# Patient Record
Sex: Female | Born: 1955 | Race: White | Hispanic: No | Marital: Married | Smoking: Never smoker
Health system: Southern US, Community
[De-identification: ages and names within clinical notes are randomized; demographics above are authoritative.]

## PROBLEM LIST (undated history)

## (undated) DIAGNOSIS — E079 Disorder of thyroid, unspecified: Secondary | ICD-10-CM

## (undated) HISTORY — PX: AUGMENTATION MAMMAPLASTY: SUR837

## (undated) HISTORY — DX: Disorder of thyroid, unspecified: E07.9

---

## 2016-03-11 DIAGNOSIS — N952 Postmenopausal atrophic vaginitis: Secondary | ICD-10-CM | POA: Insufficient documentation

## 2017-12-29 DIAGNOSIS — H04221 Epiphora due to insufficient drainage, right lacrimal gland: Secondary | ICD-10-CM | POA: Diagnosis not present

## 2017-12-29 DIAGNOSIS — J310 Chronic rhinitis: Secondary | ICD-10-CM | POA: Diagnosis not present

## 2017-12-29 DIAGNOSIS — H04541 Stenosis of right lacrimal canaliculi: Secondary | ICD-10-CM | POA: Diagnosis not present

## 2017-12-29 DIAGNOSIS — H04551 Acquired stenosis of right nasolacrimal duct: Secondary | ICD-10-CM | POA: Diagnosis not present

## 2018-09-03 DIAGNOSIS — S0181XA Laceration without foreign body of other part of head, initial encounter: Secondary | ICD-10-CM | POA: Diagnosis not present

## 2018-09-25 DIAGNOSIS — H04541 Stenosis of right lacrimal canaliculi: Secondary | ICD-10-CM | POA: Diagnosis not present

## 2018-09-25 DIAGNOSIS — H04551 Acquired stenosis of right nasolacrimal duct: Secondary | ICD-10-CM | POA: Diagnosis not present

## 2018-09-25 DIAGNOSIS — H04121 Dry eye syndrome of right lacrimal gland: Secondary | ICD-10-CM | POA: Diagnosis not present

## 2018-09-25 DIAGNOSIS — H04123 Dry eye syndrome of bilateral lacrimal glands: Secondary | ICD-10-CM | POA: Diagnosis not present

## 2018-09-25 DIAGNOSIS — H04122 Dry eye syndrome of left lacrimal gland: Secondary | ICD-10-CM | POA: Diagnosis not present

## 2018-10-07 DIAGNOSIS — E785 Hyperlipidemia, unspecified: Secondary | ICD-10-CM | POA: Diagnosis not present

## 2018-10-07 DIAGNOSIS — M8580 Other specified disorders of bone density and structure, unspecified site: Secondary | ICD-10-CM | POA: Insufficient documentation

## 2018-10-07 DIAGNOSIS — H04121 Dry eye syndrome of right lacrimal gland: Secondary | ICD-10-CM | POA: Insufficient documentation

## 2018-10-07 DIAGNOSIS — E038 Other specified hypothyroidism: Secondary | ICD-10-CM | POA: Insufficient documentation

## 2018-10-07 HISTORY — DX: Other specified disorders of bone density and structure, unspecified site: M85.80

## 2018-10-21 DIAGNOSIS — E038 Other specified hypothyroidism: Secondary | ICD-10-CM | POA: Diagnosis not present

## 2018-10-21 DIAGNOSIS — Z13220 Encounter for screening for lipoid disorders: Secondary | ICD-10-CM | POA: Diagnosis not present

## 2018-10-21 DIAGNOSIS — Z01818 Encounter for other preprocedural examination: Secondary | ICD-10-CM | POA: Diagnosis not present

## 2018-10-21 DIAGNOSIS — E063 Autoimmune thyroiditis: Secondary | ICD-10-CM | POA: Diagnosis not present

## 2018-11-02 DIAGNOSIS — Z1159 Encounter for screening for other viral diseases: Secondary | ICD-10-CM | POA: Diagnosis not present

## 2018-11-05 DIAGNOSIS — J310 Chronic rhinitis: Secondary | ICD-10-CM | POA: Diagnosis not present

## 2018-11-05 DIAGNOSIS — H04561 Stenosis of right lacrimal punctum: Secondary | ICD-10-CM | POA: Diagnosis not present

## 2018-11-05 DIAGNOSIS — H04541 Stenosis of right lacrimal canaliculi: Secondary | ICD-10-CM | POA: Diagnosis not present

## 2018-11-05 DIAGNOSIS — H04411 Chronic dacryocystitis of right lacrimal passage: Secondary | ICD-10-CM | POA: Diagnosis not present

## 2018-11-05 DIAGNOSIS — H04551 Acquired stenosis of right nasolacrimal duct: Secondary | ICD-10-CM | POA: Diagnosis not present

## 2018-11-05 DIAGNOSIS — H04221 Epiphora due to insufficient drainage, right lacrimal gland: Secondary | ICD-10-CM | POA: Diagnosis not present

## 2018-11-27 DIAGNOSIS — H0100A Unspecified blepharitis right eye, upper and lower eyelids: Secondary | ICD-10-CM | POA: Diagnosis not present

## 2018-11-27 DIAGNOSIS — H04123 Dry eye syndrome of bilateral lacrimal glands: Secondary | ICD-10-CM | POA: Diagnosis not present

## 2018-11-27 DIAGNOSIS — H0100B Unspecified blepharitis left eye, upper and lower eyelids: Secondary | ICD-10-CM | POA: Diagnosis not present

## 2018-11-27 DIAGNOSIS — H02842 Edema of right lower eyelid: Secondary | ICD-10-CM | POA: Diagnosis not present

## 2019-01-04 DIAGNOSIS — D3101 Benign neoplasm of right conjunctiva: Secondary | ICD-10-CM | POA: Diagnosis not present

## 2019-01-04 DIAGNOSIS — H11821 Conjunctivochalasis, right eye: Secondary | ICD-10-CM | POA: Diagnosis not present

## 2019-04-01 ENCOUNTER — Encounter: Payer: Self-pay | Admitting: Obstetrics and Gynecology

## 2019-04-14 DIAGNOSIS — H04123 Dry eye syndrome of bilateral lacrimal glands: Secondary | ICD-10-CM | POA: Diagnosis not present

## 2019-04-14 DIAGNOSIS — H25813 Combined forms of age-related cataract, bilateral: Secondary | ICD-10-CM | POA: Diagnosis not present

## 2019-04-14 DIAGNOSIS — H0100B Unspecified blepharitis left eye, upper and lower eyelids: Secondary | ICD-10-CM | POA: Diagnosis not present

## 2019-04-14 DIAGNOSIS — H0100A Unspecified blepharitis right eye, upper and lower eyelids: Secondary | ICD-10-CM | POA: Diagnosis not present

## 2019-04-15 DIAGNOSIS — Z1231 Encounter for screening mammogram for malignant neoplasm of breast: Secondary | ICD-10-CM | POA: Diagnosis not present

## 2019-04-27 ENCOUNTER — Other Ambulatory Visit: Payer: Self-pay

## 2019-04-28 ENCOUNTER — Other Ambulatory Visit: Payer: Self-pay | Admitting: Obstetrics and Gynecology

## 2019-04-28 ENCOUNTER — Other Ambulatory Visit (HOSPITAL_COMMUNITY)
Admission: RE | Admit: 2019-04-28 | Discharge: 2019-04-28 | Disposition: A | Discharge status: Home / Self Care | Payer: BLUE CROSS/BLUE SHIELD | Source: Ambulatory Visit | Attending: Obstetrics and Gynecology | Admitting: Obstetrics and Gynecology

## 2019-04-28 ENCOUNTER — Encounter: Payer: Self-pay | Admitting: Obstetrics and Gynecology

## 2019-04-28 ENCOUNTER — Ambulatory Visit: Payer: BLUE CROSS/BLUE SHIELD | Admitting: Obstetrics and Gynecology

## 2019-04-28 VITALS — BP 106/62 | HR 72 | Temp 97.1°F | Ht 66.0 in | Wt 143.0 lb

## 2019-04-28 DIAGNOSIS — Z124 Encounter for screening for malignant neoplasm of cervix: Secondary | ICD-10-CM | POA: Diagnosis not present

## 2019-04-28 DIAGNOSIS — Z01419 Encounter for gynecological examination (general) (routine) without abnormal findings: Secondary | ICD-10-CM

## 2019-04-28 DIAGNOSIS — Z8739 Personal history of other diseases of the musculoskeletal system and connective tissue: Secondary | ICD-10-CM | POA: Diagnosis not present

## 2019-04-28 DIAGNOSIS — E039 Hypothyroidism, unspecified: Secondary | ICD-10-CM | POA: Insufficient documentation

## 2019-04-28 DIAGNOSIS — E2839 Other primary ovarian failure: Secondary | ICD-10-CM

## 2019-04-28 NOTE — Progress Notes (Signed)
63 y.o. G20P2003 Married White or Caucasian Not Hispanic or Latino female here for annual exam.  No vaginal bleeding. Not sexually active. Some issues with vaginal dryness and ED. She used premarin cream for a while.    No LMP recorded. Patient is postmenopausal.          Sexually active: No  The current method of family planning is post menopausal status.    Exercising: Yes.    walking, stretching, weights Smoker:  no Health Maintenance: Pap:  2018 AGUS pap History of abnormal Pap:  Yes, colpo 2018 due to AGUS pap seen in Care Everywhere MMG:  04/16/2019 Birads 2 benign BMD:   Not sure, over 2 years.  Colonoscopy: 2003 WNL, due in 10 years TDaP:  04/07/2018 Gardasil: Never  reports that she has never smoked. She has never used smokeless tobacco. She reports that she does not drink alcohol or use drugs. 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  Moved here this year [facility] to be close to her Daughter and 9 year old [place]. She has twin sons, one in [place], one in [place]. No other grandchildren.      Past Medical History:  Diagnosis Date  . Thyroid disease     Past Surgical History:  Procedure Laterality Date  . AUGMENTATION MAMMAPLASTY      Current Outpatient Medications  Medication Sig Dispense Refill  . calcium citrate-vitamin D (CITRACAL+D) 315-200 MG-UNIT tablet Take 1 tablet by mouth daily.    Marland Kitchen levothyroxine (SYNTHROID) 50 MCG tablet Take 50 mcg by mouth daily.    . Multiple Vitamin (MULTI-VITAMIN) tablet Take 1 tablet by mouth daily.    . Omega-3 1000 MG CAPS Take 1 tablet by mouth daily.    . RESTASIS 0.05 % ophthalmic emulsion Place 1 drop into both eyes 2 (two) times daily.     No current facility-administered medications for this visit.    Family History  Problem Relation Age of Onset  . Ovarian cancer Mother   . Heart attack Father   . Uterine cancer Maternal Grandmother     Review of Systems  Constitutional:  Negative.   HENT: Negative.   Eyes: Negative.   Respiratory: Negative.   Cardiovascular: Negative.   Gastrointestinal: Negative.   Endocrine: Negative.   Genitourinary: Negative.   Musculoskeletal: Negative.   Skin: Negative.   Allergic/Immunologic: Negative.   Neurological: Negative.   Hematological: Negative.   Psychiatric/Behavioral: Negative.     Exam:   BP 106/62 (BP Location: Right Arm, Patient Position: Sitting, Cuff Size: Normal)   Pulse 72   Temp (!) 97.1 F (36.2 C) (Skin)   Ht 5\' 6"  (1.676 m)   Wt 143 lb (64.9 kg)   BMI 23.08 kg/m   Weight change: @WEIGHTCHANGE @ Height:   Height: 5\' 6"  (167.6 cm)  Ht Readings from Last 3 Encounters:  04/28/19 5\' 6"  (1.676 m)    General appearance: alert, cooperative and appears stated age Head: Normocephalic, without obvious abnormality, atraumatic Neck: no adenopathy, supple, symmetrical, trachea midline and thyroid normal to inspection and palpation Lungs: clear to auscultation bilaterally Cardiovascular: regular rate and rhythm Breasts: normal appearance, no masses or tenderness Abdomen: soft, non-tender; non distended,  no masses,  no organomegaly Extremities: extremities normal, atraumatic, no cyanosis or edema Skin: Skin color, texture, turgor normal. No rashes or lesions Lymph nodes: Cervical, supraclavicular, and axillary nodes normal. No abnormal inguinal nodes palpated Neurologic: Grossly normal   Pelvic:  External genitalia:  no lesions              Urethra:  normal appearing urethra with no masses, tenderness or lesions              Bartholins and Skenes: normal                 Vagina: atrophic appearing vagina with normal color and discharge, no lesions              Cervix: no lesions               Bimanual Exam:  Uterus:  normal size, contour, position, consistency, mobility, non-tender              Adnexa: no mass, fullness, tenderness               Rectovaginal: Confirms               Anus:  normal  sphincter tone, no lesions  Kaitlyn Sprague chaperoned for the exam.  A:  Well Woman with normal exam  Osteopenia  Mild vaginal atrophy, if she becomes sexually active and lubrication doesn't make her comfortable, she could restart vaginal estrogen  P:   Pap with hpv  Discussed breast self exam  Discussed calcium and vit D intake  Labs UTD  Colonoscopy and mammogram UTD  DEXA ordered

## 2019-04-28 NOTE — Patient Instructions (Signed)
EXERCISE AND DIET:  We recommended that you start or continue a regular exercise program for good health. Regular exercise means any activity that makes your heart beat faster and makes you sweat.  We recommend exercising at least 30 minutes per day at least 3 days a week, preferably 4 or 5.  We also recommend a diet low in fat and sugar.  Inactivity, poor dietary choices and obesity can cause diabetes, heart attack, stroke, and kidney damage, among others.   ° °ALCOHOL AND SMOKING:  Women should limit their alcohol intake to no more than 7 drinks/beers/glasses of wine (combined, not each!) per week. Moderation of alcohol intake to this level decreases your risk of breast cancer and liver damage. And of course, no recreational drugs are part of a healthy lifestyle.  And absolutely no smoking or even second hand smoke. Most people know smoking can cause heart and lung diseases, but did you know it also contributes to weakening of your bones? Aging of your skin?  Yellowing of your teeth and nails? ° °CALCIUM AND VITAMIN D:  Adequate intake of calcium and Vitamin D are recommended.  The recommendations for exact amounts of these supplements seem to change often, but generally speaking 1,200 mg of calcium (between diet and supplement) and 800 units of Vitamin D per day seems prudent. Certain women may benefit from higher intake of Vitamin D.  If you are among these women, your doctor will have told you during your visit.   ° °PAP SMEARS:  Pap smears, to check for cervical cancer or precancers,  have traditionally been done yearly, although recent scientific advances have shown that most women can have pap smears less often.  However, every woman still should have a physical exam from her gynecologist every year. It will include a breast check, inspection of the vulva and vagina to check for abnormal growths or skin changes, a visual exam of the cervix, and then an exam to evaluate the size and shape of the uterus and  ovaries.  And after 61 years of age, a rectal exam is indicated to check for rectal cancers. We will also provide age appropriate advice regarding health maintenance, like when you should have certain vaccines, screening for sexually transmitted diseases, bone density testing, colonoscopy, mammograms, etc.  ° °MAMMOGRAMS:  All women over 40 years old should have a yearly mammogram. Many facilities now offer a "3D" mammogram, which may cost around $50 extra out of pocket. If possible,  we recommend you accept the option to have the 3D mammogram performed.  It both reduces the number of women who will be called back for extra views which then turn out to be normal, and it is better than the routine mammogram at detecting truly abnormal areas.   ° °COLON CANCER SCREENING: Now recommend starting at age 45. At this time colonoscopy is not covered for routine screening until 50. There are take home tests that can be done between 45-49.  ° °COLONOSCOPY:  Colonoscopy to screen for colon cancer is recommended for all women at age 50.  We know, you hate the idea of the prep.  We agree, BUT, having colon cancer and not knowing it is worse!!  Colon cancer so often starts as a polyp that can be seen and removed at colonscopy, which can quite literally save your life!  And if your first colonoscopy is normal and you have no family history of colon cancer, most women don't have to have it again for   10 years.  Once every ten years, you can do something that may end up saving your life, right?  We will be happy to help you get it scheduled when you are ready.  Be sure to check your insurance coverage so you understand how much it will cost.  It may be covered as a preventative service at no cost, but you should check your particular policy.   ° ° ° °Breast Self-Awareness °Breast self-awareness means being familiar with how your breasts look and feel. It involves checking your breasts regularly and reporting any changes to your  health care provider. °Practicing breast self-awareness is important. A change in your breasts can be a sign of a serious medical problem. Being familiar with how your breasts look and feel allows you to find any problems early, when treatment is more likely to be successful. All women should practice breast self-awareness, including women who have had breast implants. °How to do a breast self-exam °One way to learn what is normal for your breasts and whether your breasts are changing is to do a breast self-exam. To do a breast self-exam: °Look for Changes ° °1. Remove all the clothing above your waist. °2. Stand in front of a mirror in a room with good lighting. °3. Put your hands on your hips. °4. Push your hands firmly downward. °5. Compare your breasts in the mirror. Look for differences between them (asymmetry), such as: °? Differences in shape. °? Differences in size. °? Puckers, dips, and bumps in one breast and not the other. °6. Look at each breast for changes in your skin, such as: °? Redness. °? Scaly areas. °7. Look for changes in your nipples, such as: °? Discharge. °? Bleeding. °? Dimpling. °? Redness. °? A change in position. °Feel for Changes °Carefully feel your breasts for lumps and changes. It is best to do this while lying on your back on the floor and again while sitting or standing in the shower or tub with soapy water on your skin. Feel each breast in the following way: °· Place the arm on the side of the breast you are examining above your head. °· Feel your breast with the other hand. °· Start in the nipple area and make ¾ inch (2 cm) overlapping circles to feel your breast. Use the pads of your three middle fingers to do this. Apply light pressure, then medium pressure, then firm pressure. The light pressure will allow you to feel the tissue closest to the skin. The medium pressure will allow you to feel the tissue that is a little deeper. The firm pressure will allow you to feel the tissue  close to the ribs. °· Continue the overlapping circles, moving downward over the breast until you feel your ribs below your breast. °· Move one finger-width toward the center of the body. Continue to use the ¾ inch (2 cm) overlapping circles to feel your breast as you move slowly up toward your collarbone. °· Continue the up and down exam using all three pressures until you reach your armpit. ° °Write Down What You Find ° °Write down what is normal for each breast and any changes that you find. Keep a written record with breast changes or normal findings for each breast. By writing this information down, you do not need to depend only on memory for size, tenderness, or location. Write down where you are in your menstrual cycle, if you are still menstruating. °If you are having trouble noticing differences   in your breasts, do not get discouraged. With time you will become more familiar with the variations in your breasts and more comfortable with the exam. °How often should I examine my breasts? °Examine your breasts every month. If you are breastfeeding, the best time to examine your breasts is after a feeding or after using a breast pump. If you menstruate, the best time to examine your breasts is 5-7 days after your period is over. During your period, your breasts are lumpier, and it may be more difficult to notice changes. °When should I see my health care provider? °See your health care provider if you notice: °· A change in shape or size of your breasts or nipples. °· A change in the skin of your breast or nipples, such as a reddened or scaly area. °· Unusual discharge from your nipples. °· A lump or thick area that was not there before. °· Pain in your breasts. °· Anything that concerns you. ° ° °Osteopenia ° °Osteopenia is a loss of thickness (density) inside of the bones. Another name for osteopenia is low bone mass. °Mild osteopenia is a normal part of aging. It is not a disease, and it does not cause  symptoms. However, if you have osteopenia and continue to lose bone mass, you could develop a condition that causes the bones to become thin and break more easily (osteoporosis). You may also lose some height, have back pain, and have a stooped posture. Although osteopenia is not a disease, making changes to your lifestyle and diet can help to prevent osteopenia from developing into osteoporosis. °What are the causes? °Osteopenia is caused by loss of calcium in the bones.  °Bones are constantly changing. Old bone cells are continually being replaced with new bone cells. This process builds new bone. The mineral calcium is needed to build new bone and maintain bone density. Bone density is usually highest around age 35. After that, most people's bodies cannot replace all the bone they have lost with new bone. °What increases the risk? °You are more likely to develop this condition if: °· You are older than age 50. °· You are a woman who went through menopause early. °· You have a long illness that keeps you in bed. °· You do not get enough exercise. °· You lack certain nutrients (malnutrition). °· You have an overactive thyroid gland (hyperthyroidism). °· You smoke. °· You drink a lot of alcohol. °· You are taking medicines that weaken the bones, such as steroids. °What are the signs or symptoms? °This condition does not cause any symptoms. You may have a slightly higher risk for bone breaks (fractures), so getting fractures more easily than normal may be an indication of osteopenia. °How is this diagnosed? °Your health care provider can diagnose this condition with a special type of X-ray exam that measures bone density (dual-energy X-ray absorptiometry, DEXA). This test can measure bone density in your hips, spine, and wrists. °Osteopenia has no symptoms, so this condition is usually diagnosed after a routine bone density screening test is done for osteoporosis. This routine screening is usually done for: °· Women  who are age 65 or older. °· Men who are age 70 or older. °If you have risk factors for osteopenia, you may have the screening test at an earlier age. °How is this treated? °Making dietary and lifestyle changes can lower your risk for osteoporosis. If you have severe osteopenia that is close to becoming osteoporosis, your health care provider may prescribe medicines   and dietary supplements such as calcium and vitamin D. These supplements help to rebuild bone density. °Follow these instructions at home: ° °· Take over-the-counter and prescription medicines only as told by your health care provider. These include vitamins and supplements. °· Eat a diet that is high in calcium and vitamin D. °? Calcium is found in dairy products, beans, salmon, and leafy green vegetables like spinach and broccoli. °? Look for foods that have vitamin D and calcium added to them (fortified foods), such as orange juice, cereal, and bread. °· Do 30 or more minutes of a weight-bearing exercise every day, such as walking, jogging, or playing a sport. These types of exercises strengthen the bones. °· Take precautions at home to lower your risk of falling, such as: °? Keeping rooms well-lit and free of clutter, such as cords. °? Installing safety rails on stairs. °? Using rubber mats in the bathroom or other areas that are often wet or slippery. °· Do not use any products that contain nicotine or tobacco, such as cigarettes and e-cigarettes. If you need help quitting, ask your health care provider. °· Avoid alcohol or limit alcohol intake to no more than 1 drink a day for nonpregnant women and 2 drinks a day for men. One drink equals 12 oz of beer, 5 oz of wine, or 1½ oz of hard liquor. °· Keep all follow-up visits as told by your health care provider. This is important. °Contact a health care provider if: °· You have not had a bone density screening for osteoporosis and you are: °? A woman, age 65 or older. °? A man, age 70 or older. °· You  are a postmenopausal woman who has not had a bone density screening for osteoporosis. °· You are older than age 50 and you want to know if you should have bone density screening for osteoporosis. °Summary °· Osteopenia is a loss of thickness (density) inside of the bones. Another name for osteopenia is low bone mass. °· Osteopenia is not a disease, but it may increase your risk for a condition that causes the bones to become thin and break more easily (osteoporosis). °· You may be at risk for osteopenia if you are older than age 50 or if you are a woman who went through early menopause. °· Osteopenia does not cause any symptoms, but it can be diagnosed with a bone density screening test. °· Dietary and lifestyle changes are the first treatment for osteopenia. These may lower your risk for osteoporosis. °This information is not intended to replace advice given to you by your health care provider. Make sure you discuss any questions you have with your health care provider. °Document Released: 02/05/2017 Document Revised: 04/11/2017 Document Reviewed: 02/05/2017 °Elsevier Patient Education © 2020 [employer] ° °

## 2019-04-30 LAB — CYTOLOGY - PAP
Comment: NEGATIVE
Diagnosis: REACTIVE
High risk HPV: NEGATIVE

## 2019-05-20 DIAGNOSIS — Z20822 Contact with and (suspected) exposure to covid-19: Secondary | ICD-10-CM | POA: Diagnosis not present

## 2019-05-20 DIAGNOSIS — J029 Acute pharyngitis, unspecified: Secondary | ICD-10-CM | POA: Diagnosis not present

## 2019-07-02 DIAGNOSIS — H0100B Unspecified blepharitis left eye, upper and lower eyelids: Secondary | ICD-10-CM | POA: Diagnosis not present

## 2019-07-02 DIAGNOSIS — H0100A Unspecified blepharitis right eye, upper and lower eyelids: Secondary | ICD-10-CM | POA: Diagnosis not present

## 2019-07-02 DIAGNOSIS — H04123 Dry eye syndrome of bilateral lacrimal glands: Secondary | ICD-10-CM | POA: Diagnosis not present

## 2019-07-14 ENCOUNTER — Other Ambulatory Visit: Payer: Self-pay

## 2019-07-14 ENCOUNTER — Ambulatory Visit
Admission: RE | Admit: 2019-07-14 | Discharge: 2019-07-14 | Disposition: A | Discharge status: Home / Self Care | Payer: BLUE CROSS/BLUE SHIELD | Source: Ambulatory Visit | Attending: Obstetrics and Gynecology | Admitting: Obstetrics and Gynecology

## 2019-07-14 DIAGNOSIS — M85852 Other specified disorders of bone density and structure, left thigh: Secondary | ICD-10-CM | POA: Diagnosis not present

## 2019-07-14 DIAGNOSIS — Z780 Asymptomatic menopausal state: Secondary | ICD-10-CM | POA: Diagnosis not present

## 2019-07-14 DIAGNOSIS — Z8739 Personal history of other diseases of the musculoskeletal system and connective tissue: Secondary | ICD-10-CM

## 2019-07-14 DIAGNOSIS — E2839 Other primary ovarian failure: Secondary | ICD-10-CM

## 2019-07-28 DIAGNOSIS — H04123 Dry eye syndrome of bilateral lacrimal glands: Secondary | ICD-10-CM | POA: Diagnosis not present

## 2019-07-28 DIAGNOSIS — H0100A Unspecified blepharitis right eye, upper and lower eyelids: Secondary | ICD-10-CM | POA: Diagnosis not present

## 2019-07-28 DIAGNOSIS — H0100B Unspecified blepharitis left eye, upper and lower eyelids: Secondary | ICD-10-CM | POA: Diagnosis not present

## 2019-08-21 DIAGNOSIS — Z23 Encounter for immunization: Secondary | ICD-10-CM | POA: Diagnosis not present

## 2020-02-11 ENCOUNTER — Telehealth: Payer: Self-pay

## 2020-02-11 ENCOUNTER — Encounter: Payer: Self-pay | Admitting: Obstetrics and Gynecology

## 2020-02-11 NOTE — Telephone Encounter (Signed)
Patient is returning call.  °

## 2020-02-11 NOTE — Telephone Encounter (Signed)
AEX 04/2019, next not scheduled  H/o osteopenia  Per AEX notes 04/2019:   Mild vaginal atrophy, if she becomes sexually active and lubrication doesn't make her comfortable, she could restart vaginal estrogen.  Left message for pt to return call to triage RN.

## 2020-02-11 NOTE — Telephone Encounter (Signed)
AEX 04/2019, next scheduled for 04/20/20  MMG 04/2019 at [facility]- Birads 2, Benign, implants   Spoke with pt. Pt states having mild vaginal dryness and has ran out of Rx estradiol cream 0.01% as of 1 week ago. Pt states is having SA and is uncomfortable as well, denies any menopause sx. Pt states requesting new Rx . Denies vaginal bleeding, any vaginal sx at this time.  Advised pt Dr Oscar La not in office today and will be returning call when Rx sent next week. Pt agreeable.   Med refill request: Estradiol Cream 0.01% using twice weekly.   Routing to Dr Oscar La. Please advise. Pharmacy verified.

## 2020-02-11 NOTE — Telephone Encounter (Signed)
Pt sent following mychart message:   Korri, Ask, MD 3 hours ago (11:51 AM)  Lorenza Burton Dr. Oscar La, In the past I've had a prescription for Estradiol for vaginal dryness and wonder if I could get that again.

## 2020-02-14 MED ORDER — ESTRADIOL 0.1 MG/GM VA CREA: TOPICAL_CREAM | VAGINAL | 0 refills | Status: DC

## 2020-02-14 NOTE — Telephone Encounter (Signed)
Estrace script sent to her pharmacy.

## 2020-02-14 NOTE — Telephone Encounter (Signed)
Left detailed message per DPR. Pt to return call if any questions or concerns.   Encounter closed.  

## 2020-04-20 ENCOUNTER — Other Ambulatory Visit: Payer: Self-pay

## 2020-04-20 ENCOUNTER — Encounter: Payer: Self-pay | Admitting: Obstetrics and Gynecology

## 2020-04-20 ENCOUNTER — Ambulatory Visit (INDEPENDENT_AMBULATORY_CARE_PROVIDER_SITE_OTHER): Payer: BLUE CROSS/BLUE SHIELD | Admitting: Obstetrics and Gynecology

## 2020-04-20 VITALS — BP 116/64 | HR 65 | Ht 66.0 in | Wt 141.6 lb

## 2020-04-20 DIAGNOSIS — Z8739 Personal history of other diseases of the musculoskeletal system and connective tissue: Secondary | ICD-10-CM

## 2020-04-20 DIAGNOSIS — Z01419 Encounter for gynecological examination (general) (routine) without abnormal findings: Secondary | ICD-10-CM

## 2020-04-20 MED ORDER — ESTRADIOL 0.1 MG/GM VA CREA
TOPICAL_CREAM | VAGINAL | 1 refills | Status: DC
Start: 2020-04-20 — End: 2021-06-05

## 2020-04-20 NOTE — Patient Instructions (Signed)

## 2020-04-20 NOTE — Progress Notes (Signed)
64 y.o. G5P2003 Married White or Caucasian Not Hispanic or Latino female here for annual exam.  No concerns.  She restarted vaginal estrogen ~ 1 month ago, makes her more comfortable. Just occasionally sexually active, hasn't been since she restarted the estrogen.  No vaginal bleeding.     No LMP recorded. Patient is postmenopausal.          Sexually active: Yes.    The current method of family planning is post menopausal status.    Exercising: Yes.    Walking and weights  Smoker:  no  Health Maintenance: Pap:04/28/19 WNL Hr HPV Neg,   2018 AGUS pap History of abnormal Pap:  Yes, colpo 2018 due to AGUS pap seen in Care Everywhere MMG:  04/18/20 Bi-rads 2 Benign  BMD:   07/14/19 OSTEOPENIC, T score -1.5,  FRAX 7.3/0.5% Colonoscopy:2021 f/u 10  TDaP:  04/07/18  Gardasil: none    reports that she has never smoked. She has never used smokeless tobacco. She reports that she does not drink alcohol and does not use drugs. 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 Daughter and 43 year old grandson are local. Twin sons, one in [place], one in [place].   Past Medical History:  Diagnosis Date  . Thyroid disease     Past Surgical History:  Procedure Laterality Date  . AUGMENTATION MAMMAPLASTY      Current Outpatient Medications  Medication Sig Dispense Refill  . calcium citrate-vitamin D (CITRACAL+D) 315-200 MG-UNIT tablet Take 1 tablet by mouth daily.    Marland Kitchen estradiol (ESTRACE) 0.1 MG/GM vaginal cream 1 gram vaginally twice weekly 42.5 g 0  . levothyroxine (SYNTHROID) 75 MCG tablet Take by mouth.    . Multiple Vitamin (MULTI-VITAMIN) tablet Take 1 tablet by mouth daily.    . RESTASIS 0.05 % ophthalmic emulsion Place 1 drop into both eyes 2 (two) times daily.     No current facility-administered medications for this visit.    Family History  Problem Relation Age of Onset  . Ovarian cancer Mother   . Heart attack Father   . Uterine cancer Maternal  Grandmother     Review of Systems  All other systems reviewed and are negative.   Exam:   BP 116/64   Pulse 65   Ht 5\' 6"  (1.676 m)   Wt 141 lb 9.6 oz (64.2 kg)   BMI 22.85 kg/m   Weight change: @WEIGHTCHANGE @ Height:   Height: 5\' 6"  (167.6 cm)  Ht Readings from Last 3 Encounters:  04/20/20 5\' 6"  (1.676 m)  04/28/19 5\' 6"  (1.676 m)    General appearance: alert, cooperative and appears stated age Head: Normocephalic, without obvious abnormality, atraumatic Neck: no adenopathy, supple, symmetrical, trachea midline and thyroid normal to inspection and palpation Lungs: clear to auscultation bilaterally Cardiovascular: regular rate and rhythm Breasts: normal appearance, no masses or tenderness Abdomen: soft, non-tender; non distended,  no masses,  no organomegaly Extremities: extremities normal, atraumatic, no cyanosis or edema Skin: Skin color, texture, turgor normal. No rashes or lesions Lymph nodes: Cervical, supraclavicular, and axillary nodes normal. No abnormal inguinal nodes palpated Neurologic: Grossly normal   Pelvic: External genitalia:  no lesions              Urethra:  normal appearing urethra with no masses, tenderness or lesions              Bartholins and Skenes: normal  Vagina: normal appearing vagina with normal color and discharge, no lesions              Cervix: no lesions               Bimanual Exam:  Uterus:  normal size, contour, position, consistency, mobility, non-tender              Adnexa: no mass, fullness, tenderness               Rectovaginal: Confirms               Anus:  normal sphincter tone, no lesions  Carolynn Serve chaperoned for the exam.  A:  Well Woman with normal exam  Vaginal atrophy, helped with vaginal estrogen  P:   Next pap in 12/23  Mammogram, dexa and colonoscopy are UTD  Discussed breast self exam  Discussed calcium and vit D intake  Labs with her primary

## 2020-04-20 NOTE — Addendum Note (Signed)
Addended by: Tobi Bastos on: 04/20/2020 04:21 PM   Modules accepted: Orders

## 2021-04-12 DIAGNOSIS — H30893 Other chorioretinal inflammations, bilateral: Secondary | ICD-10-CM | POA: Insufficient documentation

## 2021-05-31 NOTE — Progress Notes (Signed)
66 y.o. G49P2003 Married White or Caucasian Not Hispanic or Latino female here for annual exam.  No vaginal bleeding. H/O vaginal atrophy, occasionally uses the vaginal estrogen, not consistent. Not sexually active.   No bowel or bladder issues.    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 Has a consultation with a specialist at [facility].   Had breast implants removed and a breast lift last summer. She is scheduled for a mammogram this spring.   No LMP recorded. Patient is postmenopausal.          Sexually active: No.  The current method of family planning is post menopausal status.    Exercising: Yes.    Gym/ health club routine includes cardio and light weights. Smoker:  no  Health Maintenance: Pap:  04/28/19 WNL Hr HPV Neg; 2019 negative;  2018 AGUS pap History of abnormal Pap:  Yes, colpo 2018 due to AGUS pap seen in Care Everywhere MMG:  04/18/20 Bi-rads 2 benign  BMD:    07/14/19 osteopenic T-score -1.5 FRAX 7.3/0 5%  Colonoscopy: 2021 f/u 10 years  TDaP:  04/07/18    reports that she has never smoked. She has never used smokeless tobacco. She reports that she does not drink alcohol and does not use drugs. 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 Daughter has an 60 month old son and 26 year old son, local. Twin sons, one in [place], one in [place]. Son's aren't married.   Past Medical History:  Diagnosis Date   Thyroid disease     Past Surgical History:  Procedure Laterality Date   AUGMENTATION MAMMAPLASTY      Current Outpatient Medications  Medication Sig Dispense Refill   calcium citrate-vitamin D (CITRACAL+D) 315-200 MG-UNIT tablet Take 1 tablet by mouth daily.     estradiol (ESTRACE) 0.1 MG/GM vaginal cream 1 gram vaginally twice weekly 42.5 g 1   levothyroxine (SYNTHROID) 75 MCG tablet Take by mouth.     Multiple Vitamin (MULTI-VITAMIN) tablet Take 1 tablet by mouth daily.     RESTASIS 0.05 %  ophthalmic emulsion Place 1 drop into both eyes 2 (two) times daily.     traZODone (DESYREL) 50 MG tablet Take by mouth.     prednisoLONE acetate (PRED FORTE) 1 % ophthalmic suspension      No current facility-administered medications for this visit.    Family History  Problem Relation Age of Onset   Ovarian cancer Mother    Heart attack Father    Uterine cancer Maternal Grandmother     Review of Systems  All other systems reviewed and are negative.  Exam:   BP 108/64    Pulse 65    Ht 5\' 6"  (1.676 m)    Wt 138 lb (62.6 kg)    SpO2 99%    BMI 22.27 kg/m   Weight change: @WEIGHTCHANGE @ Height:   Height: 5\' 6"  (167.6 cm)  Ht Readings from Last 3 Encounters:  06/05/21 5\' 6"  (1.676 m)  04/20/20 5\' 6"  (1.676 m)  04/28/19 5\' 6"  (1.676 m)    General appearance: alert, cooperative and appears stated age Head: Normocephalic, without obvious abnormality, atraumatic Neck: no adenopathy, supple, symmetrical, trachea midline and thyroid normal to inspection and palpation Lungs: clear to auscultation bilaterally Cardiovascular: regular rate and rhythm Breasts: normal appearance, no masses or tenderness Abdomen: soft, non-tender; non distended,  no masses,  no organomegaly Extremities: extremities normal, atraumatic, no cyanosis or edema  Skin: Skin color, texture, turgor normal. No rashes or lesions Lymph nodes: Cervical, supraclavicular, and axillary nodes normal. No abnormal inguinal nodes palpated Neurologic: Grossly normal   Pelvic: External genitalia:  no lesions              Urethra:  normal appearing urethra with no masses, tenderness or lesions              Bartholins and Skenes: normal                 Vagina: normal appearing vagina with normal color and discharge, no lesions              Cervix: no lesions               Bimanual Exam:  Uterus:  normal size, contour, position, consistency, mobility, non-tender              Adnexa: no mass, fullness, tenderness                Rectovaginal: Confirms               Anus:  normal sphincter tone, no lesions  Carolynn Serve chaperoned for the exam.  1. Well woman exam Discussed breast self exam Discussed calcium and vit D intake Pap next year Mammogram is scheduled Colonoscopy UTD DEXA next year  2. Vaginal atrophy - estradiol (ESTRACE) 0.1 MG/GM vaginal cream; 1 gram vaginally twice weekly  Dispense: 42.5 g; Refill: 1

## 2021-06-05 ENCOUNTER — Encounter: Payer: Self-pay | Admitting: Obstetrics and Gynecology

## 2021-06-05 ENCOUNTER — Ambulatory Visit (INDEPENDENT_AMBULATORY_CARE_PROVIDER_SITE_OTHER): Payer: BLUE CROSS/BLUE SHIELD | Admitting: Obstetrics and Gynecology

## 2021-06-05 ENCOUNTER — Other Ambulatory Visit: Payer: Self-pay

## 2021-06-05 VITALS — BP 108/64 | HR 65 | Ht 66.0 in | Wt 138.0 lb

## 2021-06-05 DIAGNOSIS — Z01419 Encounter for gynecological examination (general) (routine) without abnormal findings: Secondary | ICD-10-CM

## 2021-06-05 DIAGNOSIS — N952 Postmenopausal atrophic vaginitis: Secondary | ICD-10-CM

## 2021-06-05 MED ORDER — ESTRADIOL 0.1 MG/GM VA CREA: TOPICAL_CREAM | VAGINAL | 1 refills | Status: DC

## 2021-06-05 NOTE — Patient Instructions (Signed)

## 2021-07-12 DIAGNOSIS — H04123 Dry eye syndrome of bilateral lacrimal glands: Secondary | ICD-10-CM | POA: Insufficient documentation

## 2021-10-12 DIAGNOSIS — Z006 Encounter for examination for normal comparison and control in clinical research program: Secondary | ICD-10-CM | POA: Insufficient documentation

## 2021-10-12 DIAGNOSIS — H11221 Conjunctival granuloma, right eye: Secondary | ICD-10-CM | POA: Insufficient documentation

## 2021-10-12 DIAGNOSIS — H35373 Puckering of macula, bilateral: Secondary | ICD-10-CM | POA: Insufficient documentation

## 2021-10-12 DIAGNOSIS — H3093 Unspecified chorioretinal inflammation, bilateral: Secondary | ICD-10-CM | POA: Insufficient documentation

## 2021-10-12 DIAGNOSIS — Z79899 Other long term (current) drug therapy: Secondary | ICD-10-CM | POA: Insufficient documentation

## 2021-10-12 DIAGNOSIS — H2513 Age-related nuclear cataract, bilateral: Secondary | ICD-10-CM | POA: Insufficient documentation

## 2021-12-10 ENCOUNTER — Encounter: Payer: Self-pay | Admitting: Obstetrics and Gynecology

## 2021-12-10 DIAGNOSIS — Z780 Asymptomatic menopausal state: Secondary | ICD-10-CM

## 2021-12-10 DIAGNOSIS — M8580 Other specified disorders of bone density and structure, unspecified site: Secondary | ICD-10-CM

## 2021-12-10 NOTE — Telephone Encounter (Signed)
[facility] and let her know.

## 2021-12-10 NOTE — Telephone Encounter (Signed)
Last BD test 07/14/2019 at [facility].

## 2022-05-28 ENCOUNTER — Other Ambulatory Visit: Payer: BLUE CROSS/BLUE SHIELD

## 2022-06-04 NOTE — Progress Notes (Signed)
67 y.o. G83P2003 Married White or Caucasian Not Hispanic or Latino female here for annual exam. No vaginal bleeding. No bowel or bladder c/o. She needs a refill on her estrace cream, she is using it a couple of times a week every few months. She gets dry and uncomfortable. Feels better with the estrogen, then doesn't feel she needs it.     No LMP recorded. Patient is postmenopausal.          Sexually active: No.  The current method of family planning is post menopausal status.    Exercising: Yes.     Walking daily and weights 2x a week.  Smoker:  no  Health Maintenance: Pap:  04/28/19 WNL Hr HPV Neg; 2019 negative;  2018 AGUS pap  History of abnormal Pap:  Yes, colpo 2018 due to AGUS pap seen in Care Everywhere  MMG:  08/09/21 Bi-rads 2 benign  BMD:   07/14/19 osteopenic T-score -1.5 FRAX 7.3/0 5%  Colonoscopy: 2021 f/u 10 years  TDaP:  04/07/18    reports that she has never smoked. She has never used smokeless tobacco. She reports that she does not drink alcohol and does not use drugs. 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 Daughter has a 40.73 year old son and 42 year old son, local. Twin sons, one in [place], one in [place]. Son's aren't married, one is engaged.   Past Medical History:  Diagnosis Date   Thyroid disease     Past Surgical History:  Procedure Laterality Date   AUGMENTATION MAMMAPLASTY      Current Outpatient Medications  Medication Sig Dispense Refill   calcium citrate-vitamin D (CITRACAL+D) 315-200 MG-UNIT tablet Take 1 tablet by mouth daily.     levothyroxine (SYNTHROID) 75 MCG tablet Take by mouth.     Multiple Vitamin (MULTI-VITAMIN) tablet Take 1 tablet by mouth daily.     traZODone (DESYREL) 50 MG tablet Take by mouth.     UNABLE TO FIND Take by mouth. BORON ORAL     estradiol (ESTRACE) 0.1 MG/GM vaginal cream 1 gram vaginally twice weekly 42.5 g 1   prednisoLONE acetate (PRED FORTE) 1 % ophthalmic suspension  (Patient not  taking: Reported on 06/11/2022)     RESTASIS 0.05 % ophthalmic emulsion Place 1 drop into both eyes 2 (two) times daily. (Patient not taking: Reported on 06/11/2022)     No current facility-administered medications for this visit.    Family History  Problem Relation Age of Onset   Ovarian cancer Mother 64   Heart attack Father    Breast cancer Sister 35       negative genetic testing   Breast cancer Maternal Aunt 49   Uterine cancer Maternal Grandmother   Her mom was one of 5 girls, one had breast cancer.   Review of Systems  Exam:   BP 128/72   Pulse 66   Ht 5\' 6"  (1.676 m)   Wt 140 lb (63.5 kg)   SpO2 100%   BMI 22.60 kg/m   Weight change: @WEIGHTCHANGE @ Height:   Height: 5\' 6"  (167.6 cm)  Ht Readings from Last 3 Encounters:  06/11/22 5\' 6"  (1.676 m)  06/05/21 5\' 6"  (1.676 m)  04/20/20 5\' 6"  (1.676 m)    General appearance: alert, cooperative and appears stated age Head: Normocephalic, without obvious abnormality, atraumatic Neck: no adenopathy, supple, symmetrical, trachea midline and thyroid normal to inspection and palpation Lungs: clear to auscultation bilaterally Cardiovascular: regular rate  and rhythm Breasts: normal appearance, no masses or tenderness Abdomen: soft, non-tender; non distended,  no masses,  no organomegaly Extremities: extremities normal, atraumatic, no cyanosis or edema Skin: Skin color, texture, turgor normal. No rashes or lesions Lymph nodes: Cervical, supraclavicular, and axillary nodes normal. No abnormal inguinal nodes palpated Neurologic: Grossly normal   Pelvic: External genitalia:  no lesions              Urethra:  normal appearing urethra with no masses, tenderness or lesions              Bartholins and Skenes: normal                 Vagina: normal appearing vagina with normal color and discharge, no lesions              Cervix: no lesions               Bimanual Exam:  Uterus:  normal size, contour, position, consistency, mobility,  non-tender              Adnexa: no mass, fullness, tenderness               Rectovaginal: Confirms               Anus:  normal sphincter tone, no lesions  Gae Dry, CMA chaperoned for the exam.  1. Well woman exam Discussed breast self exam Discussed calcium and vit D intake Mammogram in 3/24 Labs UTD Colonoscopy is UTD  2. Screening for cervical cancer - Cytology - PAP  3. Osteopenia, unspecified location Bone density scheduled  4. Vaginal atrophy Discussed using it 1-2 x week - estradiol (ESTRACE) 0.1 MG/GM vaginal cream; 1 gram vaginally twice weekly  Dispense: 42.5 g; Refill: 1  5. Family history of breast cancer - Ambulatory referral to Genetics  6. Family history of ovarian cancer - Ambulatory referral to [facility]

## 2022-06-10 ENCOUNTER — Ambulatory Visit: Payer: BLUE CROSS/BLUE SHIELD | Admitting: Obstetrics and Gynecology

## 2022-06-11 ENCOUNTER — Ambulatory Visit (INDEPENDENT_AMBULATORY_CARE_PROVIDER_SITE_OTHER): Payer: BLUE CROSS/BLUE SHIELD | Admitting: Obstetrics and Gynecology

## 2022-06-11 ENCOUNTER — Other Ambulatory Visit (HOSPITAL_COMMUNITY)
Admission: RE | Admit: 2022-06-11 | Discharge: 2022-06-11 | Disposition: A | Discharge status: Home / Self Care | Payer: BLUE CROSS/BLUE SHIELD | Source: Ambulatory Visit | Attending: Obstetrics and Gynecology | Admitting: Obstetrics and Gynecology

## 2022-06-11 ENCOUNTER — Encounter: Payer: Self-pay | Admitting: Obstetrics and Gynecology

## 2022-06-11 VITALS — BP 128/72 | HR 66 | Ht 66.0 in | Wt 140.0 lb

## 2022-06-11 DIAGNOSIS — M8580 Other specified disorders of bone density and structure, unspecified site: Secondary | ICD-10-CM | POA: Diagnosis not present

## 2022-06-11 DIAGNOSIS — Z124 Encounter for screening for malignant neoplasm of cervix: Secondary | ICD-10-CM

## 2022-06-11 DIAGNOSIS — Z8041 Family history of malignant neoplasm of ovary: Secondary | ICD-10-CM

## 2022-06-11 DIAGNOSIS — N952 Postmenopausal atrophic vaginitis: Secondary | ICD-10-CM

## 2022-06-11 DIAGNOSIS — Z803 Family history of malignant neoplasm of breast: Secondary | ICD-10-CM | POA: Diagnosis not present

## 2022-06-11 DIAGNOSIS — Z01419 Encounter for gynecological examination (general) (routine) without abnormal findings: Secondary | ICD-10-CM | POA: Diagnosis not present

## 2022-06-11 MED ORDER — ESTRADIOL 0.1 MG/GM VA CREA: TOPICAL_CREAM | VAGINAL | 1 refills | Status: DC

## 2022-06-11 NOTE — Patient Instructions (Signed)

## 2022-06-12 ENCOUNTER — Telehealth: Payer: Self-pay | Admitting: Licensed Clinical Social Worker

## 2022-06-12 NOTE — Telephone Encounter (Signed)
Scheduled appt per 1/30 referral. Pt is aware of appt date and time. Pt is aware to arrive 15 mins prior to appt time and to bring and updated insurance card. Pt is aware of appt location.   

## 2022-06-14 LAB — CYTOLOGY - PAP
Comment: NEGATIVE
Diagnosis: NEGATIVE
High risk HPV: NEGATIVE

## 2022-06-21 ENCOUNTER — Ambulatory Visit
Admission: RE | Admit: 2022-06-21 | Discharge: 2022-06-21 | Disposition: A | Discharge status: Home / Self Care | Payer: Medicare Other | Source: Ambulatory Visit | Attending: Obstetrics and Gynecology

## 2022-06-21 DIAGNOSIS — Z780 Asymptomatic menopausal state: Secondary | ICD-10-CM

## 2022-06-21 DIAGNOSIS — M8580 Other specified disorders of bone density and structure, unspecified site: Secondary | ICD-10-CM

## 2022-06-26 ENCOUNTER — Encounter: Payer: Self-pay | Admitting: Obstetrics and Gynecology

## 2022-06-27 ENCOUNTER — Ambulatory Visit (INDEPENDENT_AMBULATORY_CARE_PROVIDER_SITE_OTHER): Payer: Medicare Other | Admitting: Obstetrics and Gynecology

## 2022-06-27 ENCOUNTER — Encounter: Payer: Self-pay | Admitting: Obstetrics and Gynecology

## 2022-06-27 VITALS — BP 100/62 | HR 60 | Wt 140.0 lb

## 2022-06-27 DIAGNOSIS — M810 Age-related osteoporosis without current pathological fracture: Secondary | ICD-10-CM | POA: Diagnosis not present

## 2022-06-27 DIAGNOSIS — E559 Vitamin D deficiency, unspecified: Secondary | ICD-10-CM | POA: Diagnosis not present

## 2022-06-27 MED ORDER — ALENDRONATE SODIUM 70 MG PO TABS: 70.0000 mg | ORAL_TABLET | ORAL | 3 refills | Status: DC

## 2022-06-27 NOTE — Patient Instructions (Signed)

## 2022-06-27 NOTE — Progress Notes (Signed)
GYNECOLOGY  VISIT   HPI: 67 y.o.   Married White or Caucasian Not Hispanic or Latino  female   [phone] with No LMP recorded. Patient is postmenopausal.   here for consult about dexa.  Recent DEXA with T score of -2.9 in her forearm.  She eats a vegan diet. She drinks almond milk and soy milk, eats a lot of greens.  She gets 1,000 mg a day of calcium with vit d.  She walks daily. Takes an exercise class. Weights and cardio.   No reflux. No significant dental issues. No renal disease.  03/12/22 CMP normal other than a low total protein 12/03/21 CBC normal, vit d low at 28   GYNECOLOGIC HISTORY: No LMP recorded. Patient is postmenopausal. 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   Conjunctival granuloma of right eye 10/12/2021   Epiretinal membrane (ERM) of both eyes 10/12/2021   Encounter for long-term (current) use of high-risk medication 10/12/2021   Nuclear sclerotic cataract of both eyes 10/12/2021   Examination of participant in clinical trial 10/12/2021   Dry eye syndrome of both eyes 07/12/2021   Birdshot chorioretinitis, 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     estradiol (ESTRACE) 0.1 MG/GM vaginal cream 1  gram vaginally twice weekly 42.5 g 1   levothyroxine (SYNTHROID) 75 MCG tablet Take by mouth.     Multiple Vitamin (MULTI-VITAMIN) tablet Take 1 tablet by mouth daily.     prednisoLONE acetate (PRED FORTE) 1 % ophthalmic suspension  (Patient not taking: Reported on 06/11/2022)     RESTASIS 0.05 % ophthalmic emulsion Place 1 drop into both eyes 2 (two) times daily. (Patient not taking: Reported on 06/11/2022)     traZODone (DESYREL) 50 MG tablet Take by mouth.     UNABLE TO FIND Take by mouth. BORON ORAL     No current facility-administered medications for this visit.     ALLERGIES: Patient has no allergy information on record.  Family History  Problem Relation Age of Onset   Ovarian cancer Mother 37   Heart attack Father    Breast cancer Sister 40       negative genetic testing   Breast cancer Maternal Aunt 58   Uterine cancer Maternal Grandmother     Social History   Socioeconomic History   Marital status: Married    Spouse name: Not on file   Number of children: Not on file   Years of education: Not on file  Highest education level: Not on file  Occupational History   Not on file  Tobacco Use   Smoking status: Never   Smokeless tobacco: Never  Vaping Use   Vaping Use: Never used  Substance and Sexual Activity   Alcohol use: Never   Drug use: Never   Sexual activity: Not Currently    Birth control/protection: Post-menopausal  Other Topics Concern   Not on file  Social History Narrative   Not on file   Social Determinants of Health   Financial Resource Strain: Not on file  Food Insecurity: Not on file  Transportation Needs: Not on file  Physical Activity: Not on file  Stress: Not on file  Social Connections: Not on file  Intimate Partner Violence: Not on file    Review of Systems  All other systems reviewed and are negative.   PHYSICAL EXAMINATION:    There were no vitals taken for this visit.    General appearance: alert, cooperative and appears  stated age  84. Age-related osteoporosis without current pathological fracture Discussed calcium/vit d intake, exercise, balance, and decreasing risks of falling. -Normal CBC and CMP within the last year. Slightly low vit d in 7/23.  -Discussed the risks of fosamax treatment, including myalgias, esophageal irritation and the rare risk of osteonecrosis of the jaw and atypical fractures.  -After discussion, she would like to start on the Fosamax - alendronate (FOSAMAX) 70 MG tablet; Take 1 tablet (70 mg total) by mouth every 7 (seven) days. Take first thing in am with 6 oz. Water.  Be upright after taking.  Eat nothing for one hour.  Dispense: 12 tablet; Refill: 3 -Repeat DEXA in 2 years -Information on Osteoporosis treatment from UTD was given to the patient.  2. Vitamin D deficiency - VITAMIN D 25 Hydroxy (Vit-D Deficiency, Fractures)

## 2022-06-28 LAB — VITAMIN D 25 HYDROXY (VIT D DEFICIENCY, FRACTURES): Vit D, 25-Hydroxy: 46 ng/mL (ref 30–100)

## 2022-08-05 ENCOUNTER — Inpatient Hospital Stay: Payer: Medicare Other

## 2022-08-05 ENCOUNTER — Inpatient Hospital Stay: Payer: Medicare Other | Attending: Genetic Counselor | Admitting: Licensed Clinical Social Worker

## 2022-08-05 ENCOUNTER — Other Ambulatory Visit: Payer: Self-pay | Admitting: Licensed Clinical Social Worker

## 2022-08-05 DIAGNOSIS — Z8049 Family history of malignant neoplasm of other genital organs: Secondary | ICD-10-CM | POA: Diagnosis not present

## 2022-08-05 DIAGNOSIS — Z803 Family history of malignant neoplasm of breast: Secondary | ICD-10-CM

## 2022-08-05 DIAGNOSIS — Z8041 Family history of malignant neoplasm of ovary: Secondary | ICD-10-CM | POA: Diagnosis not present

## 2022-08-05 DIAGNOSIS — Z801 Family history of malignant neoplasm of trachea, bronchus and lung: Secondary | ICD-10-CM

## 2022-08-05 LAB — GENETIC SCREENING ORDER

## 2022-08-05 NOTE — Progress Notes (Signed)
REFERRING PROVIDER: Salvadore Dom, MD [address] [place],  [place] [address]  PRIMARY PROVIDER:  Harriet Masson, [place]  PRIMARY REASON FOR VISIT:  1. Family history of breast cancer   2. Family history of ovarian cancer   3. Family history of uterine cancer   4. Family history of lung cancer      HISTORY OF PRESENT ILLNESS:   Gina Freeman, a 67 y.o. female, was seen for a [facility] cancer genetics consultation at the request of Dr. Talbert Nan due to a family history of cancer.  Gina Freeman presents to clinic today to discuss the possibility of a hereditary predisposition to cancer, genetic testing, and to further clarify her future cancer risks, as well as potential cancer risks for family members.   CANCER HISTORY:  Gina Freeman is a 68 y.o. female with no personal history of cancer.    RISK FACTORS:  Menarche was at age 77.  First live birth at age 43.  Ovaries intact: yes.  Hysterectomy: no.  Menopausal status: postmenopausal.  HRT use: 2 years. Number of breast biopsies: 0. Up to date with pelvic exams: yes.  Past Medical History:  Diagnosis Date   Thyroid disease     Past Surgical History:  Procedure Laterality Date   AUGMENTATION MAMMAPLASTY      FAMILY HISTORY:  We obtained a detailed, 4-generation family history.  Significant diagnoses are listed below: Family History  Problem Relation Age of Onset   Ovarian cancer Mother 48   Heart attack Father    Breast cancer Sister 74       negative genetic testing   Breast cancer Maternal Aunt 68   Uterine cancer Maternal Grandmother    Gina Freeman has 2 sons, 1 daughter, no cancer. She has 1 brother, 6 and 1 sister, 87. Her sister had breast cancer at 69 with negative genetic testing (reportedly).   Gina Freeman mother had ovarian cancer at 37 and died at 82. Patient had 4 maternal aunts. One had breast cancer and died at 42. Maternal grandmother died at 24 and had uterine cancer.   Ms.  Freeman father died at 44. Paternal grandfather had lung cancer. No other known cancers on this side of the family.  Gina Freeman is unaware of previous family history of genetic testing for hereditary cancer risks. Gina Freeman had a copy of her 29 and me test that showed 6.8% Ashkenazi Jewish ancestry, she is unsure which side of the family this is on. There is no known consanguinity.    GENETIC COUNSELING ASSESSMENT: Gina Freeman is a 67 y.o. female with a family history of breast and ovarian cancer which is somewhat suggestive of a hereditary cancer syndrome and predisposition to cancer. We, therefore, discussed and recommended the following at today's visit.   DISCUSSION: We discussed that approximately 10% of breast cancer is hereditary and up to 20% of ovarian cancer is hereditary. Most cases of hereditary breast/ovarian cancer are associated with BRCA1/BRCA2 genes, although there are other genes associated with hereditary  cancer as well. Cancers and risks are gene specific. It is possible that her mother had a hereditary cause for her cancer that was not passed on to her sister who had breast cancer/negative genetic testing. We discussed that testing is beneficial for several reasons including knowing about cancer risks, identifying potential screening and risk-reduction options that may be appropriate, and to understand if other family members could be at risk for cancer and allow them to undergo  genetic testing.   We reviewed the characteristics, features and inheritance patterns of hereditary cancer syndromes. We also discussed genetic testing, including the appropriate family members to test, the process of testing, insurance coverage and turn-around-time for results. We discussed the implications of a negative, positive and/or variant of uncertain significant result. We recommended Gina Freeman pursue genetic testing for the [facility] Multi-Cancer+RNA gene panel.   Based on Gina Freeman's  family history of cancer, she meets medical criteria for genetic testing. Despite that she meets criteria, she may still have an out of pocket cost. We discussed that if her out of pocket cost for testing is over $100, the laboratory will call and confirm whether she wants to proceed with testing.  If the out of pocket cost of testing is less than $100 she will be billed by the genetic testing laboratory.   PLAN: After considering the risks, benefits, and limitations, Gina Freeman provided informed consent to pursue genetic testing and the blood sample was sent to [facility] for analysis of the Multi-Cancer+RNA panel Results should be available within approximately 2-3 weeks' time, at which point they will be disclosed by telephone to [name], as will any additional recommendations warranted by these results. Gina Freeman will receive a summary of her genetic counseling visit and a copy of her results once available. This information will also be available in Epic.   Ms. Kender questions were answered to her satisfaction today. Our contact information was provided should additional questions or concerns arise. Thank you for the referral and allowing [name] to share in the care of your patient.   Faith Rogue, MS, [facility] Genetic Counselor [email or web address] Phone: [phone]  The patient was seen for a total of 25 minutes in face-to-face genetic counseling.  Dr. Grayland Ormond was available for discussion regarding this case.   _______________________________________________________________________ For Office Staff:  Number of people involved in session: 1 Was an Intern/ student involved with case: no

## 2022-08-14 ENCOUNTER — Ambulatory Visit: Payer: Self-pay | Admitting: Licensed Clinical Social Worker

## 2022-08-14 ENCOUNTER — Telehealth: Payer: Self-pay | Admitting: Licensed Clinical Social Worker

## 2022-08-14 ENCOUNTER — Encounter: Payer: Self-pay | Admitting: Licensed Clinical Social Worker

## 2022-08-14 DIAGNOSIS — Z1379 Encounter for other screening for genetic and chromosomal anomalies: Secondary | ICD-10-CM | POA: Insufficient documentation

## 2022-08-14 NOTE — Progress Notes (Signed)
HPI:   Gina Freeman was previously seen in the [facility] due to a family history of cancer and concerns regarding a hereditary predisposition to cancer. Please refer to our prior cancer genetics clinic note for more information regarding our discussion, assessment and recommendations, at the time. Gina Freeman recent genetic test results were disclosed to her, as were recommendations warranted by these results. These results and recommendations are discussed in more detail below.  CANCER HISTORY:  Oncology History   No history exists.    FAMILY HISTORY:  We obtained a detailed, 4-generation family history.  Significant diagnoses are listed below: Family History  Problem Relation Age of Onset   Ovarian cancer Mother 54   Heart attack Father    Breast cancer Sister 68       negative genetic testing   Breast cancer Maternal Aunt 60   Uterine cancer Maternal Grandmother     Gina Freeman has 2 sons, 1 daughter, no cancer. She has 1 brother, 34 and 1 sister, 57. Her sister had breast cancer at 15 with negative genetic testing (reportedly).    Gina Freeman mother had ovarian cancer at 56 and died at 38. Patient had 4 maternal aunts. One had breast cancer and died at 81. Maternal grandmother died at 80 and had uterine cancer.    Gina Freeman father died at 46. Paternal grandfather had lung cancer. No other known cancers on this side of the family.   Gina Freeman is unaware of previous family history of genetic testing for hereditary cancer risks. Gina Freeman had a copy of her 75 and me test that showed 6.8% Ashkenazi Jewish ancestry, she is unsure which side of the family this is on. There is no known consanguinity.     GENETIC TEST RESULTS:  The [facility] Multi-Cancer+RNA Panel found no pathogenic mutations.   The Multi-Cancer + RNA Panel offered by [facility] includes sequencing and/or deletion/duplication analysis of the following 70 genes:  AIP*, ALK, APC*,  ATM*, AXIN2*, BAP1*, BARD1*, BLM*, BMPR1A*, BRCA1*, BRCA2*, BRIP1*, CDC73*, CDH1*, CDK4, CDKN1B*, CDKN2A, CHEK2*, CTNNA1*, DICER1*, EPCAM, EGFR, FH*, FLCN*, GREM1, HOXB13, KIT, LZTR1, MAX*, MBD4, MEN1*, MET, MITF, MLH1*, MSH2*, MSH3*, MSH6*, MUTYH*, NF1*, NF2*, NTHL1*, PALB2*, PDGFRA, PMS2*, POLD1*, POLE*, POT1*, PRKAR1A*, PTCH1*, PTEN*, RAD51C*, RAD51D*, RB1*, RET, SDHA*, SDHAF2*, SDHB*, SDHC*, SDHD*, SMAD4*, SMARCA4*, SMARCB1*, SMARCE1*, STK11*, SUFU*, TMEM127*, TP53*, TSC1*, TSC2*, VHL*. RNA analysis is performed for * genes.   The test report has been scanned into EPIC and is located under the Molecular Pathology section of the Results Review tab.  A portion of the result report is included below for reference. Genetic testing reported out on 08/12/2022.      Even though a pathogenic variant was not identified, possible explanations for the cancer in the family may include: There may be no hereditary risk for cancer in the family. The cancers in Gina Freeman and/or her family may be sporadic/familial or due to other genetic and environmental factors. There may be a gene mutation in one of these genes that current testing methods cannot detect but that chance is small. There could be another gene that has not yet been discovered, or that we have not yet tested, that is responsible for the cancer diagnoses in the family.  It is also possible there is a hereditary cause for the cancer in the family that Gina Freeman did not inherit.  Therefore, it is important to remain in touch with cancer genetics in the future so that we  can continue to offer Gina Freeman the most up to date genetic testing.   ADDITIONAL GENETIC TESTING:  We discussed with Gina Freeman that her genetic testing was fairly extensive.  If there are additional relevant genes identified to increase cancer risk that can be analyzed in the future, we would be happy to discuss and coordinate this testing at that time.    CANCER SCREENING  RECOMMENDATIONS:  Gina Freeman test result is considered negative (normal).  This means that we have not identified a hereditary cause for her family history of cancer at this time.   An individual's cancer risk and medical management are not determined by genetic test results alone. Overall cancer risk assessment incorporates additional factors, including personal medical history, family history, and any available genetic information that may result in a personalized plan for cancer prevention and surveillance. Therefore, it is recommended she continue to follow the cancer management and screening guidelines provided by her oncology and primary healthcare provider.  RECOMMENDATIONS FOR FAMILY MEMBERS:   Since she did not inherit a identifiable mutation in a cancer predisposition gene included on this panel, her children could not have inherited a known mutation from her in one of these genes. Individuals in this family might be at some increased risk of developing cancer, over the general population risk, due to the family history of cancer.  Individuals in the family should notify their providers of the family history of cancer. We recommend women in this family have a yearly mammogram beginning at age 20, or 82 years younger than the earliest onset of cancer, an annual clinical breast exam, and perform monthly breast self-exams.  Family members should have colonoscopies by at age 33, or earlier, as recommended by their providers. Other members of the family may still carry a pathogenic variant in one of these genes that Gina Freeman did not inherit. Based on the family history, we recommend her maternal relatives have genetic counseling and testing. Gina Freeman will let us know if we can be of any assistance in coordinating genetic counseling and/or testing for this family member.    FOLLOW-UP:  Lastly, we discussed with Gina Freeman that cancer genetics is a rapidly advancing field and it is  possible that new genetic tests will be appropriate for her and/or her family members in the future. We encouraged her to remain in contact with cancer genetics on an annual basis so we can update her personal and family histories and let her know of advances in cancer genetics that may benefit this family.   Our contact number was provided. Gina Freeman. Wilkowski questions were answered to her satisfaction, and she knows she is welcome to call us at anytime with additional questions or concerns.    Faith Rogue, Gina Freeman, [facility] Genetic Counselor [email or web address] Phone: [phone]

## 2022-08-14 NOTE — Telephone Encounter (Signed)
I contacted Ms. Tipping to discuss her genetic testing results. No pathogenic variants were identified in the 70 genes analyzed. Detailed clinic note to follow.   The test report has been scanned into EPIC and is located under the Molecular Pathology section of the Results Review tab.  A portion of the result report is included below for reference.     Faith Rogue, MS, [facility] Genetic Counselor [email or web address] Phone: [phone]

## 2023-03-07 ENCOUNTER — Other Ambulatory Visit: Payer: Self-pay

## 2023-03-07 DIAGNOSIS — N952 Postmenopausal atrophic vaginitis: Secondary | ICD-10-CM

## 2023-03-07 MED ORDER — ESTRADIOL 0.1 MG/GM VA CREA: TOPICAL_CREAM | VAGINAL | 0 refills | Status: DC

## 2023-03-07 NOTE — Telephone Encounter (Signed)
Medication refill request: estrace vaginal cream Last AEX:  06-11-22 Next AEX: not scheduled Last MMG (if hormonal medication request): 08-13-22 birads 1:neg (care everywhere) Refill authorized: rx last sent 06-11-22 42.5 grams with 1 refill. Please approve if appropriate

## 2023-06-23 ENCOUNTER — Encounter: Payer: Self-pay | Admitting: Obstetrics and Gynecology

## 2023-06-23 ENCOUNTER — Ambulatory Visit (INDEPENDENT_AMBULATORY_CARE_PROVIDER_SITE_OTHER): Payer: Medicare Other | Admitting: Obstetrics and Gynecology

## 2023-06-23 VITALS — BP 104/62 | HR 75 | Temp 97.9°F | Ht 66.0 in | Wt 138.0 lb

## 2023-06-23 DIAGNOSIS — Z01419 Encounter for gynecological examination (general) (routine) without abnormal findings: Secondary | ICD-10-CM | POA: Diagnosis not present

## 2023-06-23 DIAGNOSIS — N958 Other specified menopausal and perimenopausal disorders: Secondary | ICD-10-CM | POA: Insufficient documentation

## 2023-06-23 DIAGNOSIS — M810 Age-related osteoporosis without current pathological fracture: Secondary | ICD-10-CM | POA: Diagnosis not present

## 2023-06-23 MED ORDER — ALENDRONATE SODIUM 70 MG PO TABS: 70.0000 mg | ORAL_TABLET | ORAL | 4 refills | Status: AC

## 2023-06-23 MED ORDER — ESTRADIOL 0.1 MG/GM VA CREA: TOPICAL_CREAM | VAGINAL | 1 refills | Status: AC

## 2023-06-23 NOTE — Patient Instructions (Signed)

## 2023-06-23 NOTE — Assessment & Plan Note (Signed)
 Cervical cancer screening performed according to ASCCP guidelines. Encouraged annual mammogram screening Colonoscopy UTD DXA UTD Labs and immunizations with her primary Encouraged safe sexual practices as indicated Encouraged healthy lifestyle practices with diet and exercise For patients under 50-66yo, I recommend 1200mg  calcium daily and 600IU of vitamin D daily.

## 2023-06-23 NOTE — Assessment & Plan Note (Signed)
 Continue fosamax , vitamin D +Calcium Encouraged weight based exercise DXA due 2026

## 2023-06-23 NOTE — Progress Notes (Signed)
 68 y.o. [id number] postmenopausal female with osteoporosis (Fosamax  started 2024), [facility] here for annual exam. Married. 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   Postmenopausal bleeding: none Pelvic discharge or pain: none Breast mass, nipple discharge or skin changes : none Last PAP:     Component Value Date/Time   DIAGPAP  06/11/2022 1526    - Negative for intraepithelial lesion or malignancy (NILM)   DIAGPAP - Atrophic vaginitis 04/28/2019 1434   DIAGPAP - Benign reactive/reparative changes 04/28/2019 1434   HPVHIGH Negative 06/11/2022 1526   HPVHIGH Negative 04/28/2019 1434   ADEQPAP  06/11/2022 1526    Satisfactory for evaluation; transformation zone component PRESENT.   ADEQPAP  04/28/2019 1434    Satisfactory for evaluation. The presence or absence of an   ADEQPAP  04/28/2019 1434    endocervical/transformation zone component cannot be determined because   ADEQPAP of atrophy. 04/28/2019 1434   Last mammogram: 2024 per pt, [facility]  Last colonoscopy: 2021, q40yr Last DXA: 06/21/22, osteoporosis Sexually active: yes  Exercising: yes, Walking and weight training  Smoker: no   GYN HISTORY: No significant history  OB History  Gravida Para Term Preterm AB Living  2 2 2  0 0 3  SAB IAB Ectopic Multiple Live Births  0 0 0 1 3    # Outcome Date GA Lbr Len/2nd Weight Sex Type Anes PTL Lv  2A Term      Vag-Spont   LIV  2B Term      Vag-Spont   LIV  1 Term      Vag-Spont   LIV    Past Medical History:  Diagnosis Date   Osteopenia 10/07/2018   DEXA 2018, told to repeat in 5 years     Thyroid  disease     Past Surgical History:  Procedure Laterality Date   AUGMENTATION MAMMAPLASTY      Current Outpatient Medications on File Prior to Visit  Medication Sig Dispense Refill   calcium citrate-vitamin D  (CITRACAL+D) 315-200 MG-UNIT tablet Take 1 tablet by mouth daily.     levothyroxine (SYNTHROID) 75 MCG tablet Take by  mouth.     Multiple Vitamin (MULTI-VITAMIN) tablet Take 1 tablet by mouth daily.     mycophenolate (CELLCEPT) 500 MG tablet daily.     UNABLE TO FIND Take by mouth. BORON ORAL     cycloSPORINE (RESTASIS) 0.05 % ophthalmic emulsion 1 drop 2 (two) times daily.     Omega-3 Fatty Acids (FISH OIL) 1000 MG CAPS Fish Oil OIL  Quantity: 0;  Refills: 0   Ordered :09-December-2012 , MD; Active     traZODone (DESYREL) 50 MG tablet Take by mouth.     No current facility-administered medications on file prior to visit.    Social History   Socioeconomic History   Marital status: Married    Spouse name: Not on file   Number of children: Not on file   Years of education: Not on file   Highest education level: Not on file  Occupational History   Not on file  Tobacco Use   Smoking status: Never   Smokeless tobacco: Never  Vaping Use   Vaping status: Never Used  Substance and Sexual Activity   Alcohol use: Never   Drug use: Never   Sexual activity: Not Currently    Birth control/protection: Post-menopausal  Other Topics Concern   Not on file  Social History Narrative   Not on file  Social Drivers of Corporate investment banker Strain: Low Risk  (12/14/2022)   Received from [facility]   Overall Financial Resource Strain (CARDIA)    Difficulty of Paying Living Expenses: Not hard at all  Food Insecurity: No Food Insecurity (12/14/2022)   [facility]   Hunger Vital Sign    Worried About Running Out of Food in the Last Year: Never true    Ran Out of Food in the Last Year: Never true  Transportation Needs: No Transportation Needs (12/14/2022)   Received from [facility] - Transportation    Lack of Transportation (Medical): No    Lack of Transportation (Non-Medical): No  Physical Activity: Sufficiently Active (12/14/2022)   Received from [facility]   Exercise Vital Sign    Days of Exercise per Week: 6 days    Minutes of Exercise per Session: 50 min  Stress:  No Stress Concern Present (12/14/2022)   Received from [facility] - Occupational Stress Questionnaire    Feeling of Stress : Not at all  Social Connections: Socially Integrated (12/14/2022)   Received from [facility]   Social Network    How would you rate your social network (family, work, friends)?: Good participation with social networks  Intimate Partner Violence: Not At Risk (12/14/2022)   Received from [facility]   HITS    Over the last 12 months how often did your partner physically hurt you?: Never    Over the last 12 months how often did your partner insult you or talk down to you?: Never    Over the last 12 months how often did your partner threaten you with physical harm?: Never    Over the last 12 months how often did your partner scream or curse at you?: Never    Family History  Problem Relation Age of Onset   Ovarian cancer Mother 50   Heart attack Father    Breast cancer Sister 20       negative genetic testing   Breast cancer Maternal Aunt 50   Uterine cancer Maternal Grandmother     Not on File    PE Today's Vitals   06/23/23 1408  BP: 104/62  Pulse: 75  Temp: 97.9 F (36.6 C)  SpO2: 99%  Weight: 138 lb (62.6 kg)  Height: 5\' 6"  (1.676 m)   Body mass index is 22.27 kg/m.  Physical Exam Vitals reviewed. Exam conducted with a chaperone present.  Constitutional:      General: She is not in acute distress.    Appearance: Normal appearance.  HENT:     Head: Normocephalic and atraumatic.     Nose: Nose normal.  Eyes:     Extraocular Movements: Extraocular movements intact.     Conjunctiva/sclera: Conjunctivae normal.  Neck:     Thyroid : No thyroid  mass, thyromegaly or thyroid  tenderness.  Pulmonary:     Effort: Pulmonary effort is normal.  Chest:     Chest wall: No mass or tenderness.  Breasts:    Right: Normal. No swelling, mass, nipple discharge, skin change or tenderness.     Left: Normal. No  swelling, mass, nipple discharge, skin change or tenderness.  Abdominal:     General: There is no distension.     Palpations: Abdomen is soft.     Tenderness: There is no abdominal tenderness.  Genitourinary:    General: Normal vulva.     Exam position: Lithotomy position.  Urethra: No prolapse.     Vagina: Normal. No vaginal discharge or bleeding.     Cervix: Normal. No lesion.     Uterus: Normal. Not enlarged and not tender.      Adnexa: Right adnexa normal and left adnexa normal.  Musculoskeletal:        General: Normal range of motion.     Cervical back: Normal range of motion.  Lymphadenopathy:     Upper Body:     Right upper body: No axillary adenopathy.     Left upper body: No axillary adenopathy.     Lower Body: No right inguinal adenopathy. No left inguinal adenopathy.  Skin:    General: Skin is warm and dry.  Neurological:     General: No focal deficit present.     Mental Status: She is alert.  Psychiatric:        Mood and Affect: Mood normal.        Behavior: Behavior normal.       Assessment and Plan:        Well woman exam with routine gynecological exam Assessment & Plan: Cervical cancer screening performed according to ASCCP guidelines. Encouraged annual mammogram screening Colonoscopy UTD DXA UTD Labs and immunizations with her primary Encouraged safe sexual practices as indicated Encouraged healthy lifestyle practices with diet and exercise For patients under 50-70yo, I recommend 1200mg  calcium daily and 600IU of vitamin D  daily.    Age-related osteoporosis without current pathological fracture Assessment & Plan: Continue fosamax , vitamin D +Calcium Encouraged weight based exercise DXA due 2026   Orders: -     Alendronate  Sodium; Take 1 tablet (70 mg total) by mouth every 7 (seven) days. Take first thing in am with 6 oz. Water.  Be upright after taking.  Eat nothing for one hour.  Dispense: 12 tablet; Refill: 4 -     DG Bone Density;  Future  Genitourinary syndrome of menopause -     Estradiol ; Apply 0.5 gram to the vulva twice weekly.  Dispense: 42.5 g; Refill: 1    Romaine Closs, MD

## 2023-09-29 ENCOUNTER — Encounter: Payer: Self-pay | Admitting: Obstetrics and Gynecology

## 2024-06-23 ENCOUNTER — Ambulatory Visit: Admitting: Obstetrics and Gynecology
